# Patient Record
Sex: Female | Born: 1980 | Race: White | Hispanic: No | Marital: Married
Health system: Midwestern US, Academic
[De-identification: ages and names within clinical notes are randomized; demographics above are authoritative.]

---

## 2017-01-24 ENCOUNTER — Encounter: Admit: 2017-01-24 | Discharge: 2017-01-24 | Payer: BC Managed Care – PPO

## 2017-02-04 ENCOUNTER — Ambulatory Visit: Admit: 2017-02-04 | Discharge: 2017-02-05 | Payer: BC Managed Care – PPO

## 2017-02-04 ENCOUNTER — Encounter: Admit: 2017-02-04 | Discharge: 2017-02-04 | Payer: BC Managed Care – PPO

## 2017-02-04 ENCOUNTER — Ambulatory Visit: Admit: 2017-02-04 | Discharge: 2017-02-04 | Payer: BC Managed Care – PPO

## 2017-02-04 DIAGNOSIS — Z8279 Family history of other congenital malformations, deformations and chromosomal abnormalities: ICD-10-CM

## 2017-02-04 DIAGNOSIS — O0991 Supervision of high risk pregnancy, unspecified, first trimester: Principal | ICD-10-CM

## 2017-02-04 DIAGNOSIS — Z3682 Encounter for antenatal screening for nuchal translucency: Principal | ICD-10-CM

## 2017-02-04 DIAGNOSIS — Z1379 Encounter for other screening for genetic and chromosomal anomalies: ICD-10-CM

## 2017-02-04 DIAGNOSIS — O09521 Supervision of elderly multigravida, first trimester: Principal | ICD-10-CM

## 2017-02-04 DIAGNOSIS — Z3481 Encounter for supervision of other normal pregnancy, first trimester: ICD-10-CM

## 2017-02-04 DIAGNOSIS — O2620 Pregnancy care for patient with recurrent pregnancy loss, unspecified trimester: ICD-10-CM

## 2017-02-04 DIAGNOSIS — N96 Recurrent pregnancy loss: ICD-10-CM

## 2017-02-04 DIAGNOSIS — Z315 Encounter for genetic counseling: ICD-10-CM

## 2017-02-04 DIAGNOSIS — Z8759 Personal history of other complications of pregnancy, childbirth and the puerperium: ICD-10-CM

## 2017-02-04 DIAGNOSIS — O283 Abnormal ultrasonic finding on antenatal screening of mother: ICD-10-CM

## 2017-02-04 NOTE — Progress Notes
Referring MD: Wenda Low [place], [place])    HPI: 52 y G9P3 at 20w2dwith hx of multiple pregnancy losses.  Last pregnancy, 18 wk IUFD.  [facility]  Karyotype showed mosaic trisomy 7.  Sat down with LGabriel Carinato discuss results.  Due to today's finding of NT 2.7 mm, she again had her counseling for AMA and prior pregnancy mosaic 7 with GDietitian      PMH none  PSH D&C x 1   Obhx see above  FH Neg  SH only social alcohol.  NKDA  Meds Progesterone 200 mg daily   Aspirin 81 mg daily    Exam:   Vitals:    02/04/17 1333   BP: 122/76   Pulse: 80         Sono: singleton IUP, NT 2.7 mm.  No other soft markers.     Labs:   01/10/17: Hgb 12.9, plt 224, TSH 1.47 (nl), HepBsAg neg, RPR neg, Rubella immune, HIV neg, A positive, ab neg,     A 89y G9P3 at 167w2dAdvanced maternal age  Prior pregnancy loss with mosaic trisomy 7    Plan  1. GrJewelleet with our genetic counselor today to review screening options for aneuploidy given prior history and today's NT of 2.7 mm.  As her issues are mainly covered by genetic counselor's visit, I will reserve consultation for another time if needed.    2. Prior multiple first trimester SAB   - have not seen testing for antiphospholipid antibody syndrome.  These labs can be sent from your office: Lupus anticoagulant, dilute russel venom viper test (DRVVT), anticardiolipin antibody, and anti-beta2glycoprotein antibody.     GeRafael BihariMD

## 2017-02-04 NOTE — Progress Notes
Jo Hill was seen at the time of her first trimester ultrasound by Debby Bud, MS, CGC for Genetic Counseling for greater than 30 minutes.  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   We discussed with Jo Hill the risk of having a child with a chromosome abnormality increases with advancing maternal age.  Specifically, at age 55 at delivery there is a 1 in 59 risk for a chromosome abnormality to occur.    We reviewed the results of ultrasound examination today which includes sonographic measurements of the nuchal translucency, nasal bone, ductus venosus and tricuspid valve waveforms.      The nuchal translucency was 2.47mm.  Nuchal translucency greater than 2.5 is considered ABNORMAL.  The nasal bone is present and the tricuspid valve and ductus venosus waveforms appear normal.           Trisomy 21 Trisomy 18 Trisomy 13    Risk associated with maternal age 29 out of 147 1 out of 366 1 out of 1146   Risk adjusted based on first trimester ultrasound 1 out of 1866 1 out of 3939 1 out of 15,319       This screening cannot take into account the previous pregnancy with mosaic Trisomy 7. Increased nuchal transluency has been associated with an increased risk for heart defects and fetal echocardiography is recommended at 20-24 weeks of gestation.    In addition to ultrasound maternal blood sampling may be utilized to increase the accuracy of this testing.  Biochemical marker screening which assess the levels of ???hCG, PAPP-A and AFP which are produced by the placenta in the first trimester may add approximately 10 percent to the detection rate for these conditions.  In addition, because they are placental in origin, it may aid in the detection of women who are at increased risk for pregnancy complications such as IUGR and preeclampsia. Another option for fetal aneuploidy analysis is through the analysis of cell-free fetal DNA which is found in maternal serum.  Cells which originate from fetal tissues are found in maternal serum and DNA from these cells may be analyzed through massively parallel sequencing.  Currently, only aneuploidy for chromosomes 13, 18, 21, X and Y and select microdeletions may be routinely reported.  Testing for other chromosomes is available, although because of the rarity of these conditions the sensitivity and specificity of this testing is unknown.   This testing is considered a screening test and positive results should be followed by diagnostic testing such as amniocentesis.    Invasive testing such as Chorionic Villus Sampling and amniocentesis were also discussed.  Chorionic Villus Sampling is performed before 14 weeks of gestation.  It involves removing a small sample of the developing placenta which generally has the same chromosomal constitution as the fetus.      Amniocentesis, which is performed at greater than 15 weeks,  requires a sample of amniotic fluid which contains cells of fetal origin.   Cells collected by either CVS or amniocentesis are grown in culture for analysis of the fetal chromosomes.    The risks, benefits and limitations of genetic amniocentesis and diagnostic ultrasound were discussed in detail.   Genetic amniocentesis carries the risk of miscarriage of approximately 1 in 500, and CVS has a risk of 1 in 200 primarily due to infection, bleeding or leakage of amniotic fluid.  Karyotype analysis could accurately predict whether or  not the fetus is affected with Down syndrome, or another chromosome abnormality, in 99.6% of cases.  In addition, cultured cells may be used for other testing such as microarray analysis.    We discussed that the patient has a previous loss with mosaic Trisomy 7.  This is likely a result of a conception with Trisomy 7 which became mosaic as a result of trisomic rescue.  We also noted that the patient had five total pregnancy losses.  We discussed that some individuals with such a history may have a rearrangement of their chromosomes, termed a translocation.  As a result of the tetrad which translocated chromosomes must form on the meiotic plate during meiosis, disruption of the disjunction of non-involved chromosomes may occur.  This may increase the risk for aneuploidy of any of the chromosomes.  We recommended chromosome analysis on the patient and her partner to assess the possibility of a translocation which may be responsible for the increased number of losses and the Trisomy 7 pregnancy.  In addition, because the risk for aneuploidy may not be accurately reflected based simply on the parameters used to screen for common chromosome abnormalities, we estimated the risk for a chromosome abnormality in the current pregnancy of one percent.  As such, either amniocentesis or CVS would be reasonable considerations given the risk vs. likelihood of detection.    As part of routine screening, the patient was offered an expanded genetic carrier screening panel which includes Cystic Fibrosis, Fragile X and Spinal Muscular Atrophy.  These disorders are common in the general population and individuals are often unaware that they are carriers for these conditions.  At this time the patient requests screening, and blood for this purpose was drawn at the time of the appointment.    After a discussion of the risks, benefits, limitations and alternatives of the various types of testing, Jo Hill requested to undergo non-invasive prenatal testing at this time.  We requested that screening for aneuploidy of all the chromosomes be performed by the laboratory.  These results will be available in approximately two weeks.  As this does not asses the risk for neural tube defects, AFP screening at greater than 15 weeks is recommended.

## 2017-02-12 ENCOUNTER — Encounter: Admit: 2017-02-12 | Discharge: 2017-02-12 | Payer: BC Managed Care – PPO

## 2017-02-12 DIAGNOSIS — Z8759 Personal history of other complications of pregnancy, childbirth and the puerperium: ICD-10-CM

## 2017-02-12 DIAGNOSIS — O09521 Supervision of elderly multigravida, first trimester: Principal | ICD-10-CM

## 2017-02-12 DIAGNOSIS — N96 Recurrent pregnancy loss: ICD-10-CM

## 2017-02-12 NOTE — Telephone Encounter
Results of non-invasive prenatal screening indicate a low risk for Trisomy 13, 18 and 21.    Test Result Interpretation   Chromosome 21 No aneuploidy detected Consistent with two copies of chromosome 21   Chromosome 18 No aneuploidy detected Consistent with two copies of chromosome 18   Chromosome 13 No aneuploidy detected Consistent with two copies of chromosome 13   Sex Chromosomes Female Consistent with two copies of sex chromosomes   All chromosomes No aneuploidy detected Consistent withtwo copies of all autosomes   22q11.2 deletion No microdeletions detected No microdeletions in region of interest   Fetal Fraction 13%      The patient was counseled that these results are limited to the determination of aneuploidy only for the chromosomes tested, and not a complete analysis of all chromosomes.  Results from this test do not eliminate the possibility that other chromosomal abnormalities may exist in this pregnancy and a negative result does not ensure an unaffected pregnancy.  While results of this testing are highly accurate, not all chromosome abnormalities may be detected due to placental, maternal or fetal mosaicism, or other causes.  If additional prenatal findings are highly suggestive of aneuploidy, amniocentesis would be recommended.    Expanded screening panel for genetic disorders was performed on the patient.  Below are listed results for the most common genetic disorders recommended by ACOG and ACMG and conditions for which the patient tested POSITIVE.  Results of the remaining disorders may be reviewed on the laboratory report.    The results reflect that the patient is of Caucasian ancestry.    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      We recommended that the patient's partner be tested for mutations in the SBDS gene to determine the risk of having an affected child. The carrier rate in the general population based on an incidence of the disease of 1 in 77,000 would be 1 in approximately 140.    Genetic counseling was offered to review the results of this carrier screening.  At this time the patient declines.    The patient was reminded that carrier screening may reduce but not eliminate the risk for the tested conditions.  In addition, this is not an exhaustive test for genetic disorders.  These results were communicated to the patient.    After these results were conveyed to the patient, they were forwarded to your office.

## 2017-02-26 NOTE — Progress Notes
Jo Hill presents for an ultrasound encounter. Past Medical, Surgical, Family & Social History; Medications & Allergies contained in the electronic record below were not reviewed today and may not be up-to-date. Please see [facility] report for all documentation related to this encounter.    02/26/2017  Jo LassMaria Shalyn Koral, MA

## 2017-11-02 ENCOUNTER — Encounter: Admit: 2017-11-02 | Discharge: 2017-11-02 | Payer: BC Managed Care – PPO

## 2018-01-02 ENCOUNTER — Encounter: Admit: 2018-01-02 | Discharge: 2018-01-02 | Payer: BC Managed Care – PPO

## 2018-06-01 IMAGING — US OBEARLY
1 series · 14 of 16 positions shown · non-contrast
Comparison: none

[Series 1: us ob <(id) single or first f · 14 of 23 slices shown]
[im 1/23]
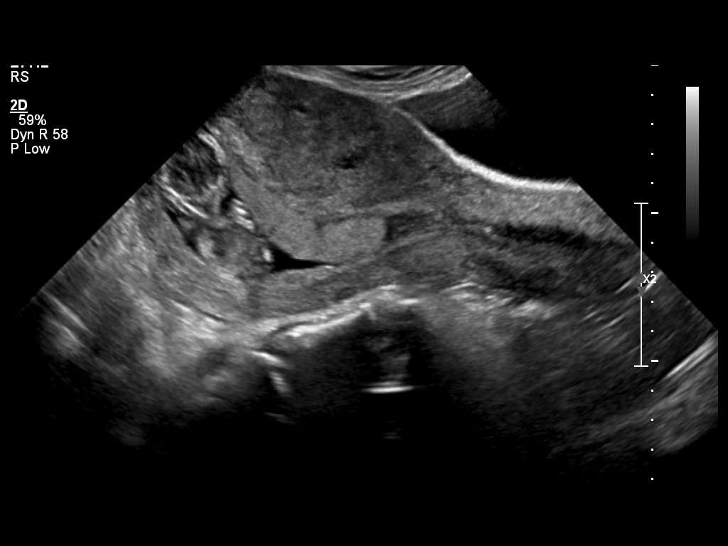
[im 2/23]
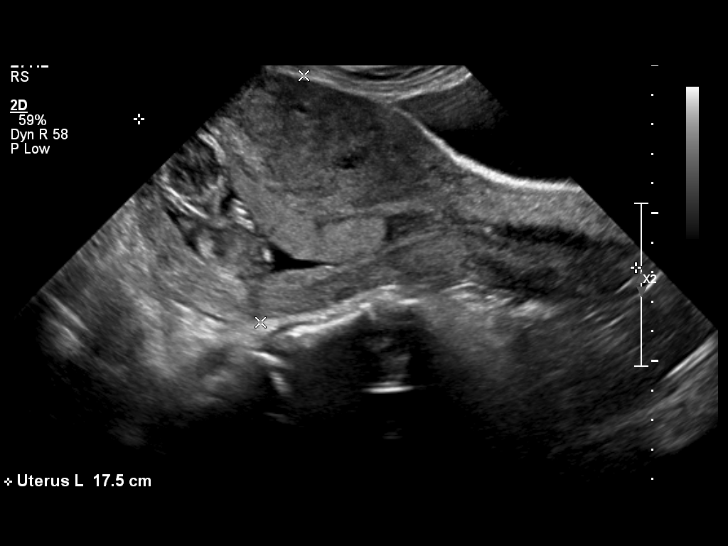
[im 3/23]
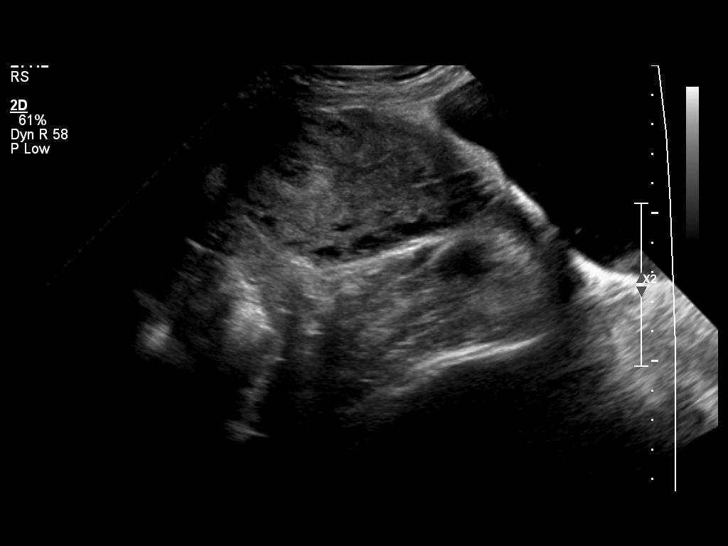
[im 6/23]
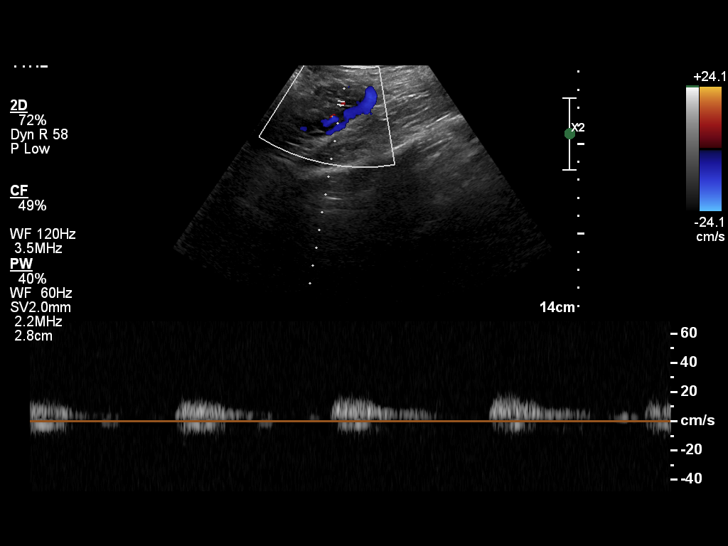
[im 8/23]
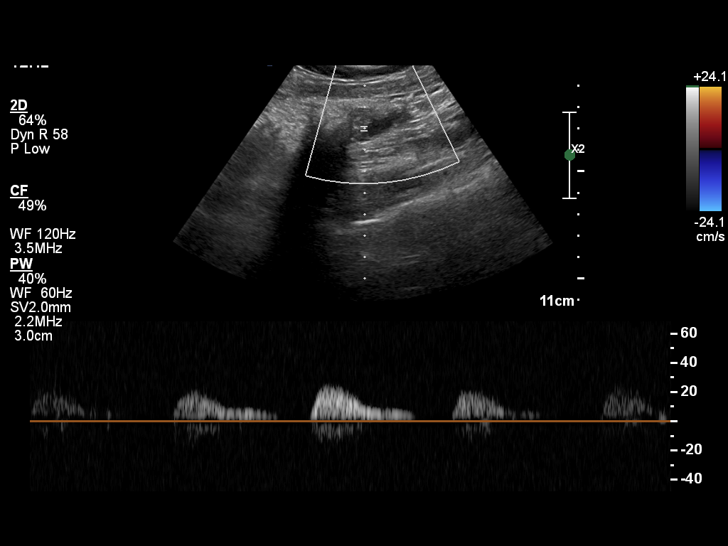
[im 9/23]
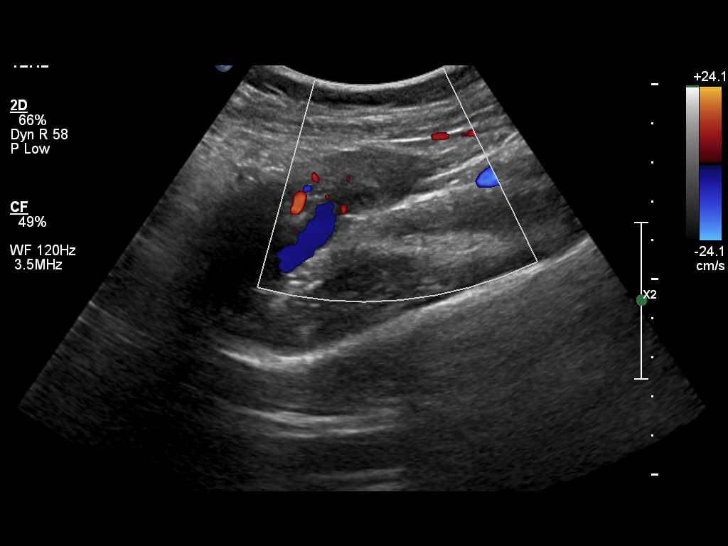
[im 11/23]
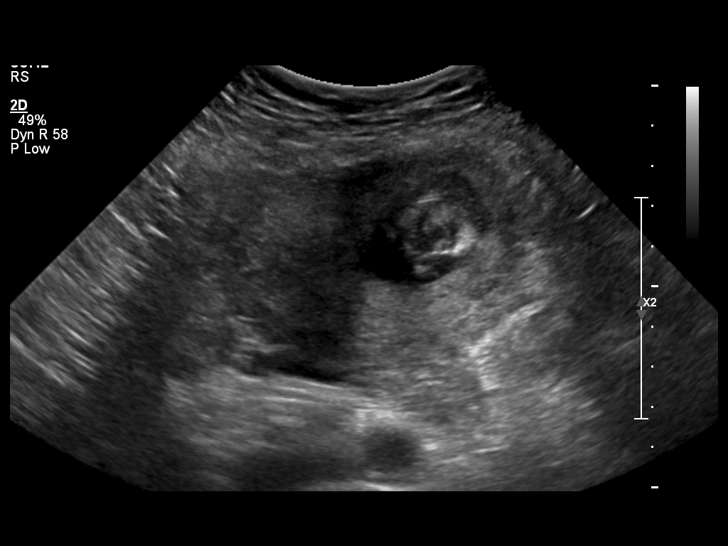
[im 12/23]
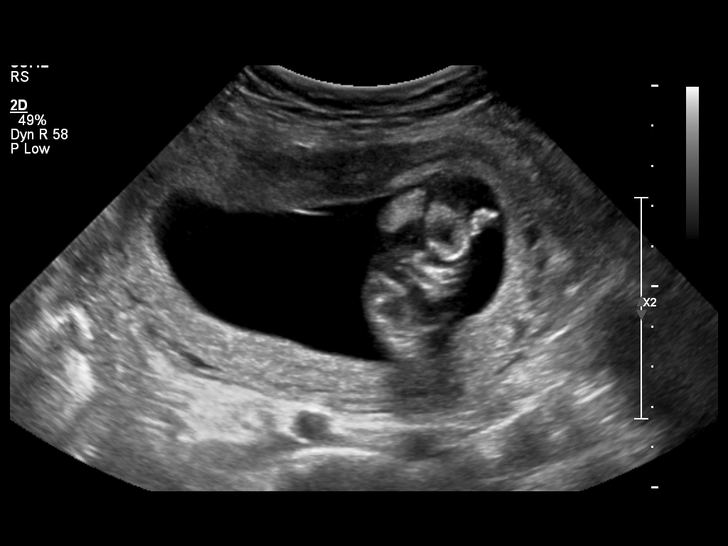
[im 14/23]
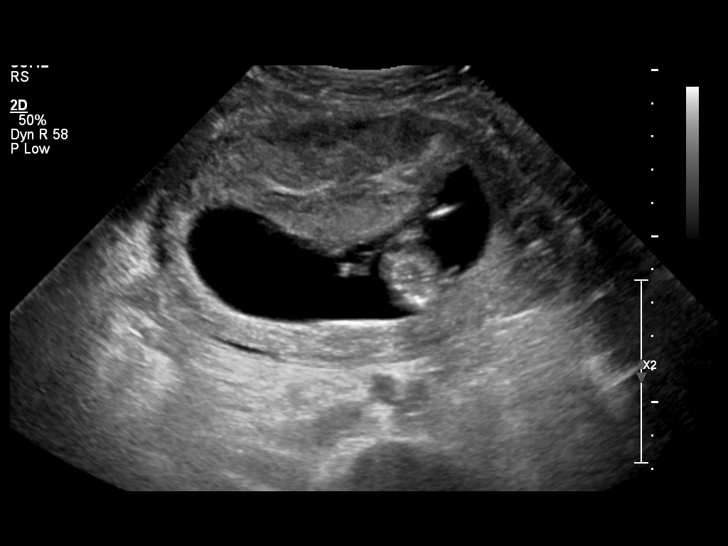
[im 15/23]
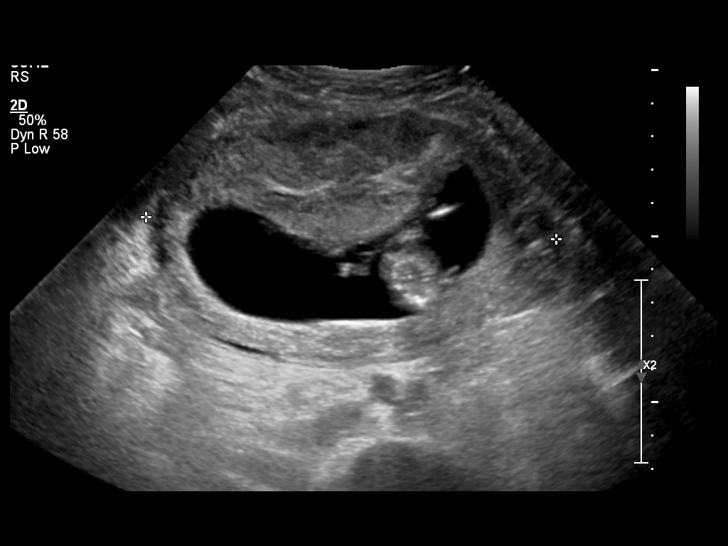
[im 18/23]
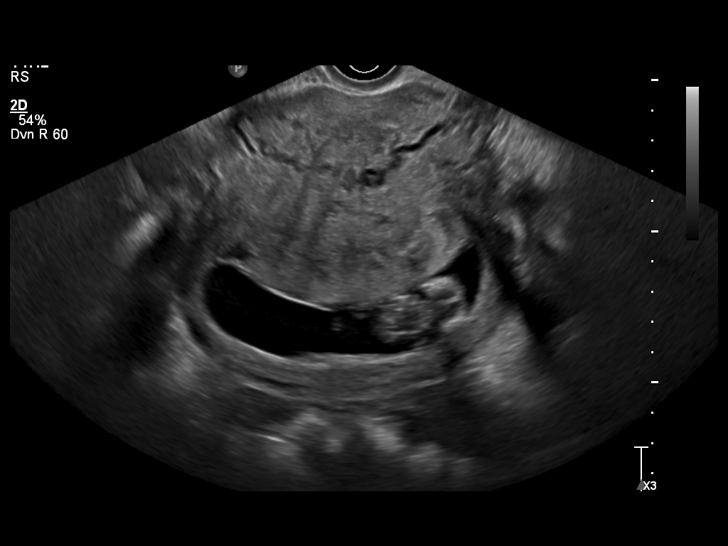
[im 20/23]
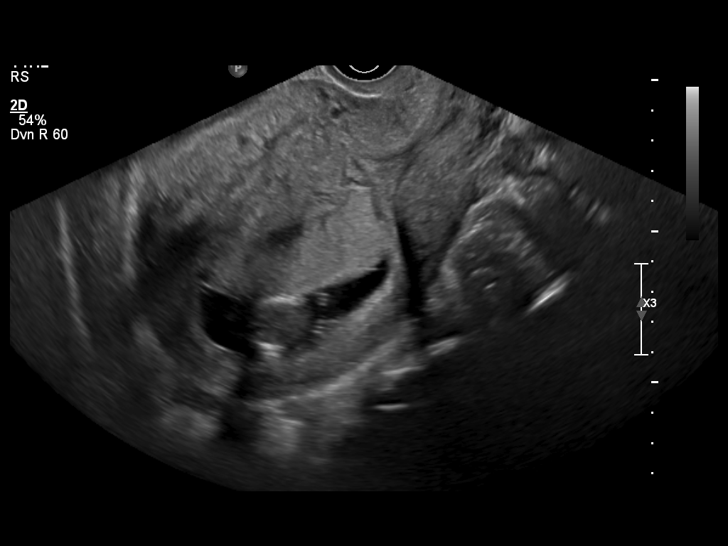
[im 21/23]
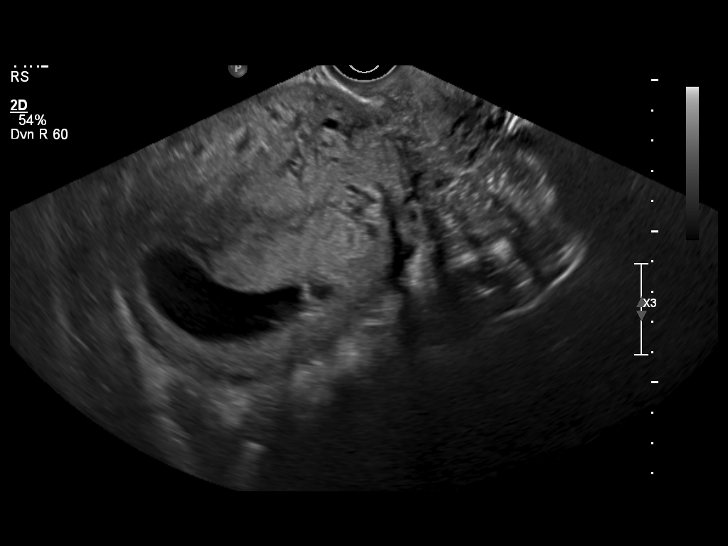
[im 23/23]
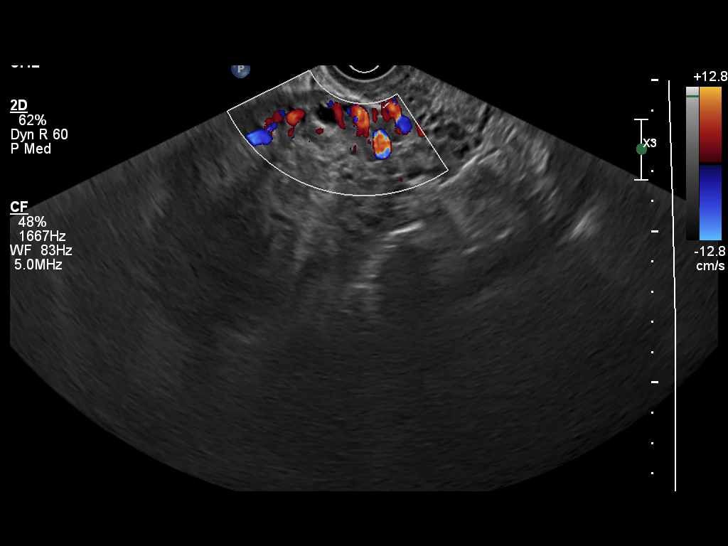

[14 of 16 positions shown; findings below may reference images not displayed]

ULTRASOUND REPORT

DIAGNOSTIC STUDIES

EXAM

ULTRASOUND PELVIS

INDICATION

confirm edc and viability
CONFIRM EDC AND VIABILITY

TECHNIQUE

Grayscale and color Doppler imaging of the intrapelvic contents was performed transabdominally
with transvaginal

COMPARISONS

None

FINDINGS

The uterus is anteverted measuring 17.5 x 8.5 x 12.4 centimeters. There is an intrauterine
pregnancy. Crown-rump length is 61.64 millimeters, consistent with gestational age 12 weeks 5 days.
Fetal cardiac motion is visualized with heart rate of 160 beats per minute.

Right ovary measures 4.9 x 2.3 x 2.5 centimeters. The left ovary measures 3.1 x 3.1 x
centimeters. There is normal ovarian blood flow bilaterally. A hypoechoic area in the left ovary
measuring 1.8 x 1.1 centimeters may be a corpus luteum cyst.

IMPRESSION

Single living intrauterine pregnancy with gestational age 12 weeks 5 days by ultrasound.

## 2019-02-18 IMAGING — US PEL
1 series · 14 of 25 positions shown · non-contrast
Comparison: none

[Series 1: us pelvic complete · 14 of 43 slices shown]
[im 1/43]
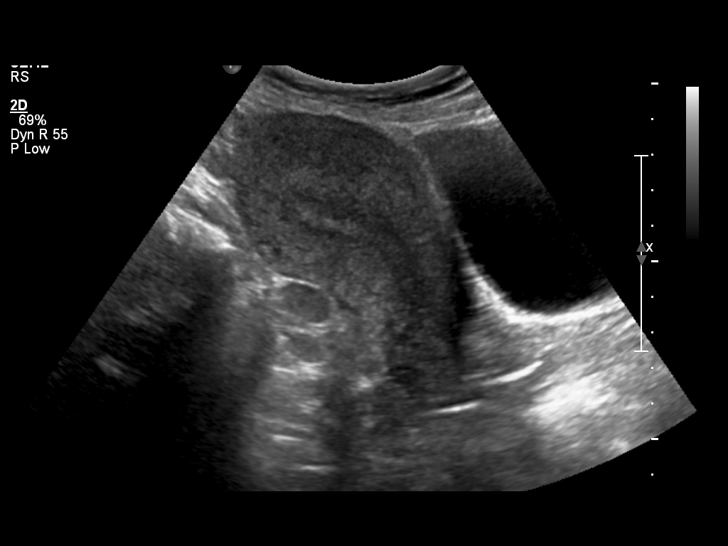
[im 4/43]
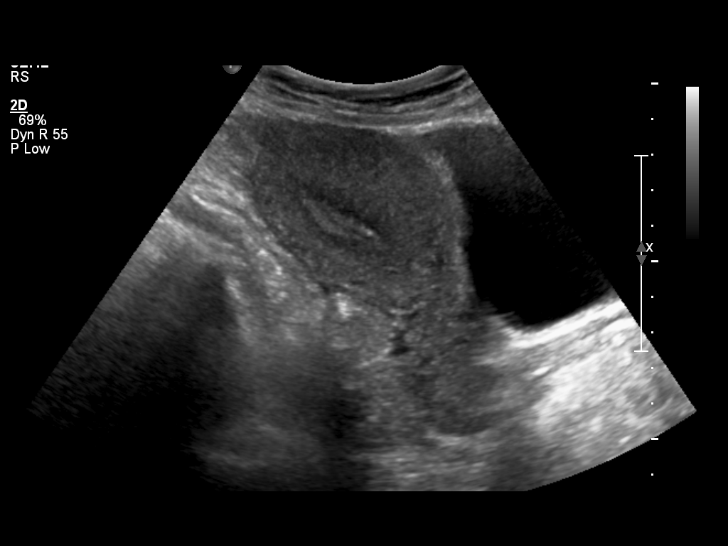
[im 8/43]
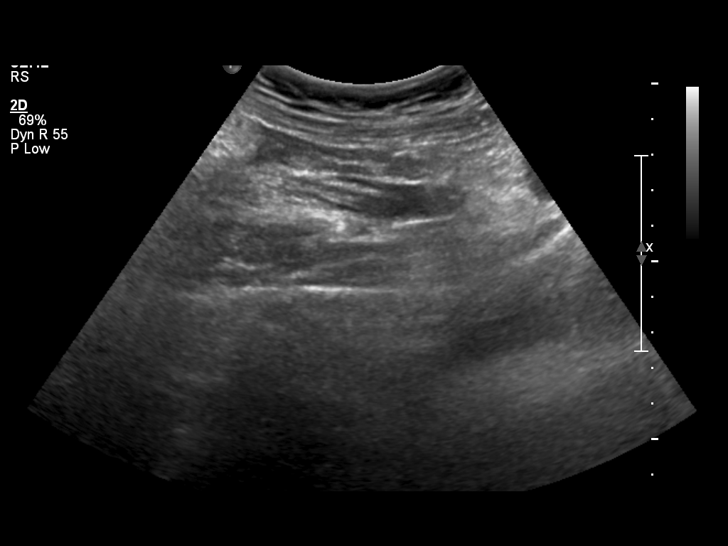
[im 11/43]
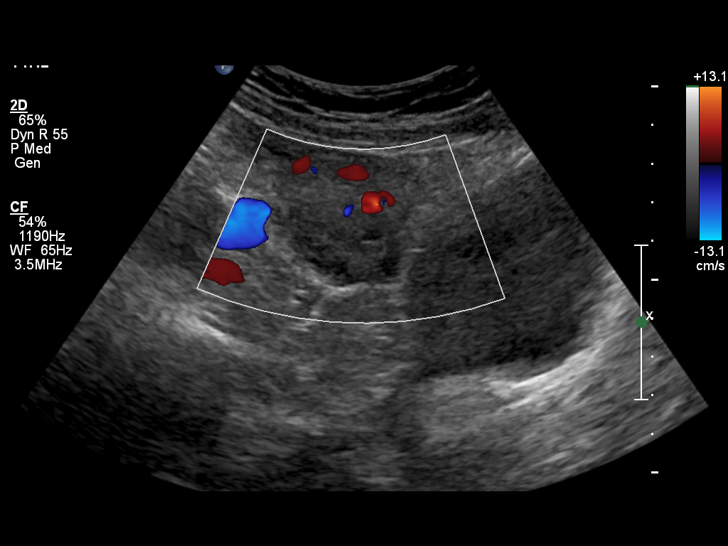
[im 15/43]
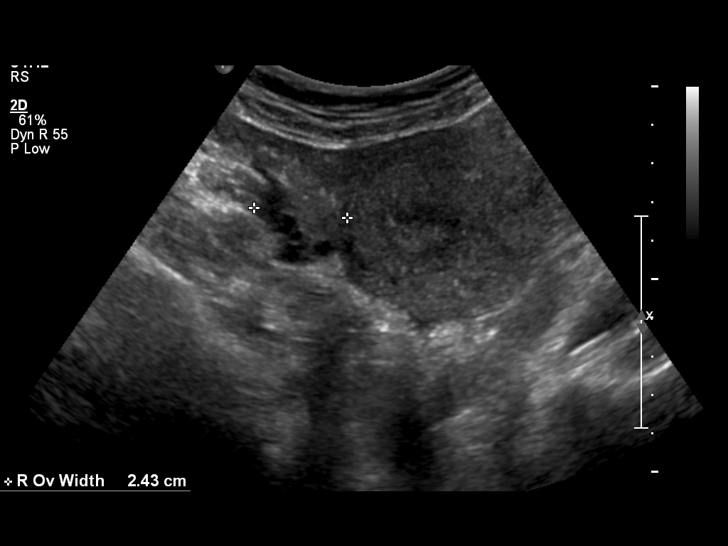
[im 16/43]
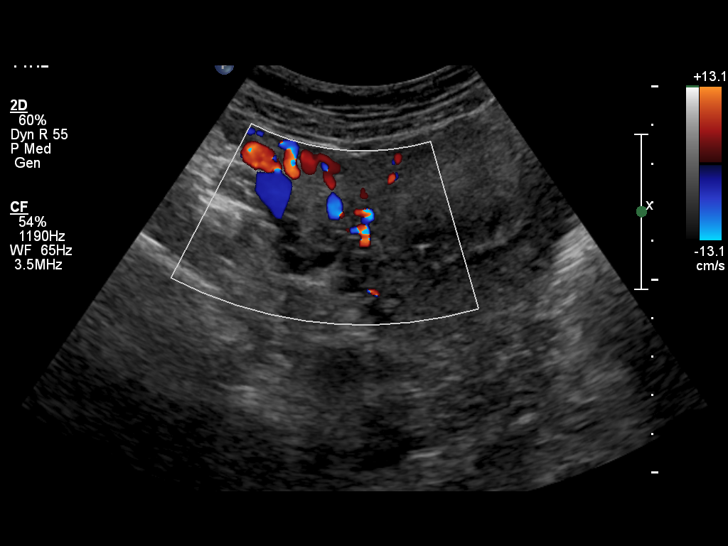
[im 20/43]
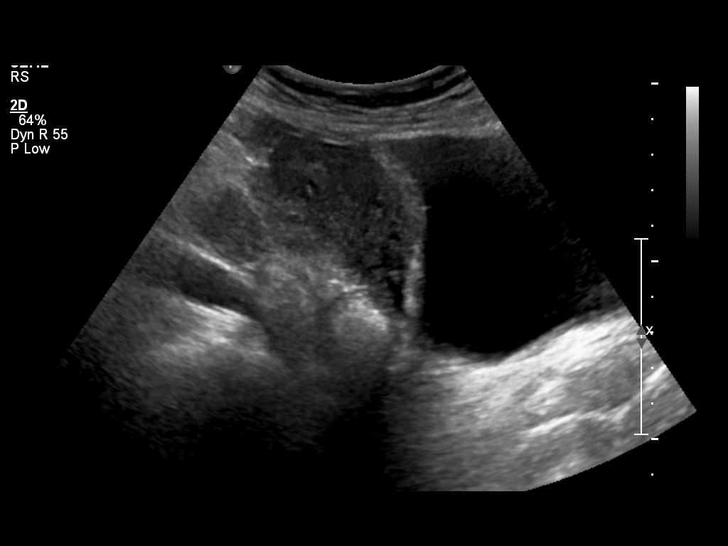
[im 23/43]
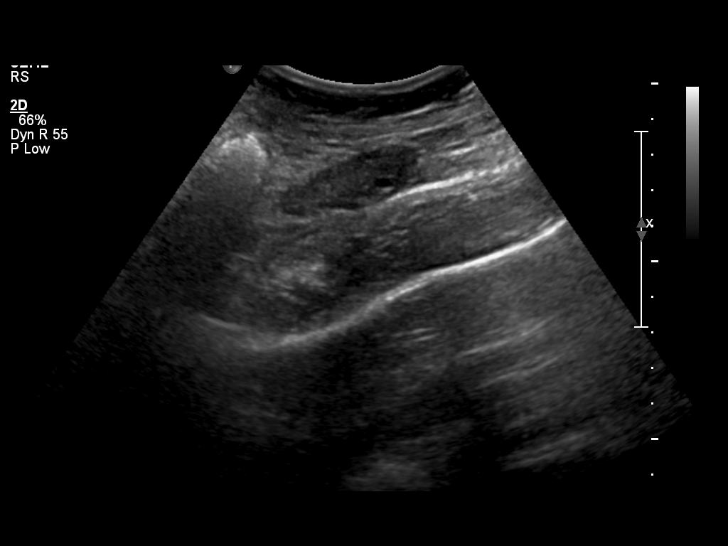
[im 27/43]
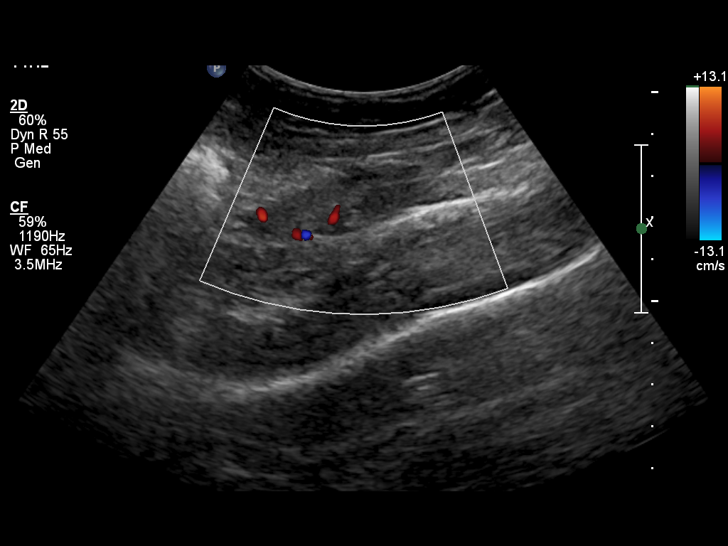
[im 29/43]
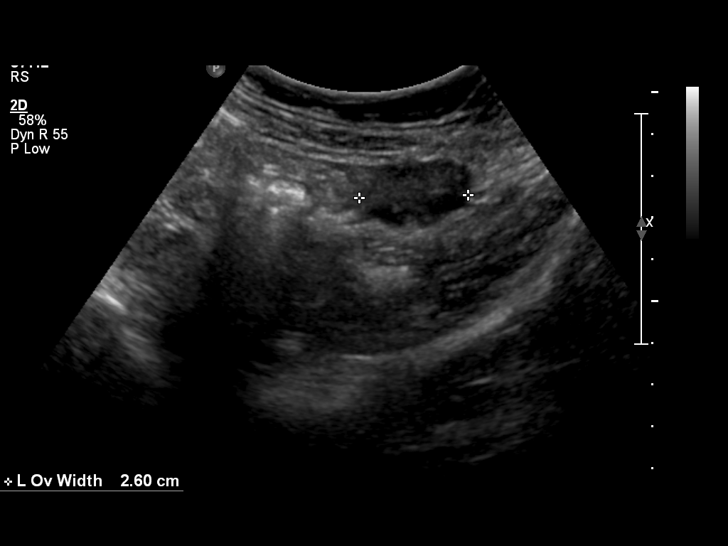
[im 32/43]
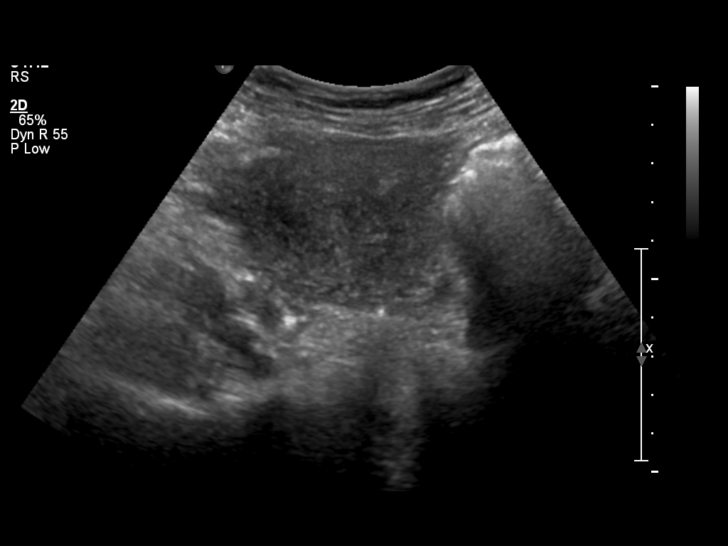
[im 36/43]
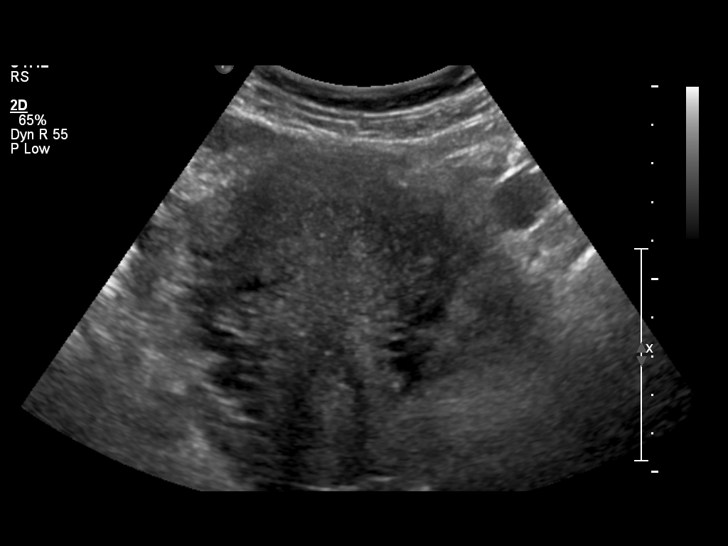
[im 39/43]
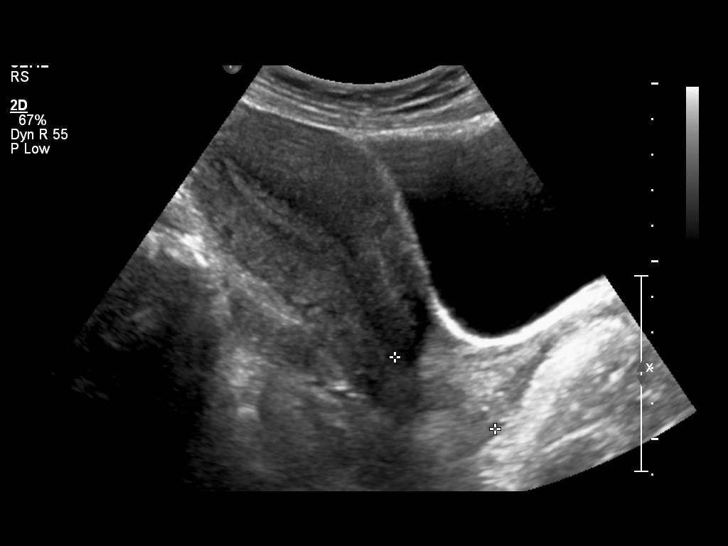
[im 43/43]
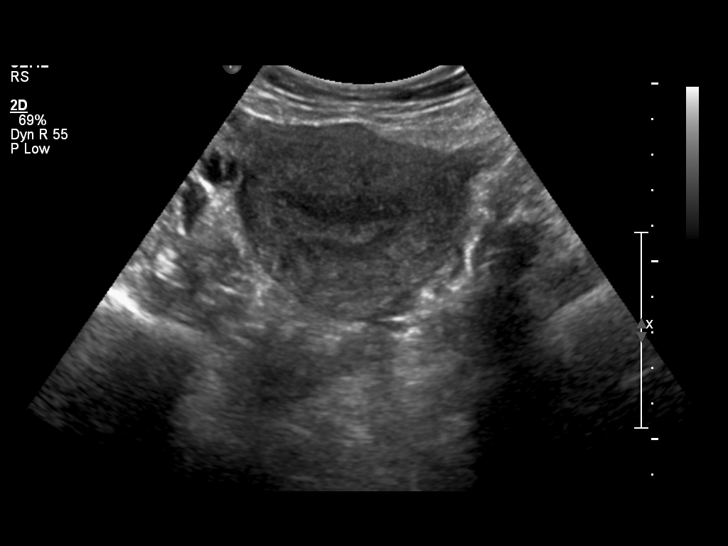

[14 of 25 positions shown; findings below may reference images not displayed]

ULTRASOUND REPORT

DIAGNOSTIC STUDIES

EXAM

ULTRASOUND, PELVIC (NONOBSTETRIC), REAL TIME WITH IMAGE DOCUMENTATION, COMPLETE, CPT 30280

INDICATION

still having spotting 9 weeks postpartum
LIGHT SPOTTING WHEN WIPING; 9 WKS POSTPARTUM; PT IS BREASTFEEDING; LMP
BEFORE PREGNANCY PER PT;

TECHNIQUE

Multiple static grayscale and color Doppler ultrasound images provided from a transabdominal
ultrasound.

COMPARISONS

No priors available for comparison.

FINDINGS

The uterus measures 10.0 x 4.9 x 6.4 centimeters. The endometrial stripe measures up to 8
millimeters. There is minimal fluid within the uterine cavity. The right ovary measures 3.6 x 2.4 x
2.4 centimeters. The left ovary measures 4.3 x 2.6 x 1.6 centimeters.

IMPRESSION

1. The endometrial stripe measures up to 8 millimeters. There is minimal fluid within the uterine
cavity.

## 2020-12-29 ENCOUNTER — Encounter: Admit: 2020-12-29 | Discharge: 2020-12-29 | Payer: BC Managed Care – PPO
